# Patient Record
Sex: Male | Born: 1990
Health system: Southern US, Community
[De-identification: ages and names within clinical notes are randomized; demographics above are authoritative.]

## PROBLEM LIST (undated history)

## (undated) DIAGNOSIS — F909 Attention-deficit hyperactivity disorder, unspecified type: Secondary | ICD-10-CM

## (undated) HISTORY — PX: APPENDECTOMY: SHX54

---
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
## 2010-01-10 ENCOUNTER — Ambulatory Visit: Payer: Self-pay | Admitting: Pediatrics

## 2010-04-19 ENCOUNTER — Ambulatory Visit: Payer: Self-pay | Admitting: Pediatrics

## 2010-07-24 LAB — DIFFERENTIAL
Basophils Absolute: 0 10*3/uL (ref 0.0–0.1)
Basophils Absolute: 0 10*3/uL (ref 0.0–0.1)
Basophils Relative: 0 % (ref 0–1)
Eosinophils Absolute: 0.2 10*3/uL (ref 0.0–0.7)
Eosinophils Relative: 3 % (ref 0–5)
Lymphocytes Relative: 26 % (ref 12–46)
Monocytes Relative: 6 % (ref 3–12)

## 2010-07-24 LAB — CBC
HCT: 48.7 % (ref 39.0–52.0)
Hemoglobin: 16.9 g/dL (ref 13.0–17.0)
MCHC: 35.2 g/dL (ref 30.0–36.0)
MCHC: 35.8 g/dL (ref 30.0–36.0)
MCV: 89.4 fL (ref 78.0–100.0)
MCV: 89.8 fL (ref 78.0–100.0)
Platelets: 161 10*3/uL (ref 150–400)
Platelets: 163 10*3/uL (ref 150–400)
RBC: 5.36 MIL/uL (ref 4.22–5.81)
RDW: 12.4 % (ref 11.5–15.5)
RDW: 12.5 % (ref 11.5–15.5)

## 2010-07-24 LAB — COMPREHENSIVE METABOLIC PANEL
ALT: 41 U/L (ref 0–53)
AST: 35 U/L (ref 0–37)
Albumin: 4.5 g/dL (ref 3.5–5.2)
Alkaline Phosphatase: 108 U/L (ref 39–117)
BUN: 10 mg/dL (ref 6–23)
Calcium: 9.4 mg/dL (ref 8.4–10.5)
GFR calc Af Amer: >60 mL/min (ref >60)
Total Bilirubin: 0.7 mg/dL (ref 0.3–1.2)
Total Protein: 7.8 g/dL (ref 6.0–8.3)

## 2010-07-24 LAB — LIPASE, BLOOD
Lipase: 21 U/L (ref 11–59)
Lipase: 22 U/L (ref 11–59)

## 2010-07-24 LAB — POCT I-STAT, CHEM 8
Chloride: 102 mEq/L (ref 96–112)
HCT: 53 % — ABNORMAL HIGH (ref 39.0–52.0)
Hemoglobin: 18 g/dL — ABNORMAL HIGH (ref 13.0–17.0)
TCO2: 28 mmol/L (ref 0–100)

## 2010-07-24 LAB — AMYLASE: Amylase: 73 U/L (ref 0–105)

## 2010-08-22 ENCOUNTER — Inpatient Hospital Stay (INDEPENDENT_AMBULATORY_CARE_PROVIDER_SITE_OTHER)
Admission: RE | Admit: 2010-08-22 | Discharge: 2010-08-22 | Disposition: A | Discharge status: Home / Self Care | Payer: 59 | Source: Ambulatory Visit | Attending: Family Medicine | Admitting: Family Medicine

## 2010-08-22 DIAGNOSIS — S20229A Contusion of unspecified back wall of thorax, initial encounter: Secondary | ICD-10-CM

## 2010-08-22 DIAGNOSIS — M799 Soft tissue disorder, unspecified: Secondary | ICD-10-CM

## 2010-08-27 ENCOUNTER — Institutional Professional Consult (permissible substitution) (INDEPENDENT_AMBULATORY_CARE_PROVIDER_SITE_OTHER): Payer: 59 | Admitting: Family

## 2010-08-27 DIAGNOSIS — F909 Attention-deficit hyperactivity disorder, unspecified type: Secondary | ICD-10-CM

## 2010-12-30 ENCOUNTER — Institutional Professional Consult (permissible substitution) (INDEPENDENT_AMBULATORY_CARE_PROVIDER_SITE_OTHER): Payer: 59 | Admitting: Family

## 2010-12-30 DIAGNOSIS — F909 Attention-deficit hyperactivity disorder, unspecified type: Secondary | ICD-10-CM

## 2011-02-26 ENCOUNTER — Emergency Department (HOSPITAL_COMMUNITY)
Admission: EM | Admit: 2011-02-26 | Discharge: 2011-02-26 | Disposition: A | Discharge status: Home / Self Care | Payer: 59 | Attending: Emergency Medicine | Admitting: Emergency Medicine

## 2011-02-26 ENCOUNTER — Emergency Department (HOSPITAL_COMMUNITY): Payer: 59

## 2011-02-26 DIAGNOSIS — S61409A Unspecified open wound of unspecified hand, initial encounter: Secondary | ICD-10-CM | POA: Insufficient documentation

## 2011-02-26 DIAGNOSIS — M79609 Pain in unspecified limb: Secondary | ICD-10-CM | POA: Insufficient documentation

## 2011-02-26 DIAGNOSIS — W278XXA Contact with other nonpowered hand tool, initial encounter: Secondary | ICD-10-CM | POA: Insufficient documentation

## 2011-03-04 NOTE — Consult Note (Signed)
NAMETREYVION, DURKEE NO.:  [id number]  MEDICAL RECORD NO.:  [id number]  LOCATION:  [facility]                         FACILITY:  [facility]  PHYSICIAN:  Dionne Ano. Rosezetta Balderston, M.D.DATE OF BIRTH:  1991/02/09  DATE OF CONSULTATION: DATE OF DISCHARGE:  02/26/2011                                CONSULTATION   I had the pleasure of seeing Donold Marotto in the emergency room.  He is a 20 year old right-hand-dominant male who sustained a laceration to his left hand today.  He cannot extend his index finger.  I was asked to see him by the emergency room staff and take over his care.  He notes pain. He has been given anesthetic in the finger.  I have reviewed this at length and his findings.  His mother is here with him.  PAST MEDICAL HISTORY:  ADD.  CURRENT MEDICINE:  Vyvanse.  ALLERGIES:  None.  PAST SURGICAL HISTORY:  Appendectomy.  SOCIAL HISTORY:  He does not smoke or drink.  He works at [employer] in [employer] arena.  PHYSICAL EXAMINATION:  GENERAL:  Pleasant male, alert and oriented, in no acute distress. VITAL SIGNS:  Stable. HEENT:  Within normal limits. CHEST:  Clear. ABDOMEN:  Nontender. EXTREMITIES:  Lower extremity examination is benign.  Right upper extremity is neurovascular intact.  His tetanus is up-to-date and I checked this.  Left hand shows a laceration, oblique in nature over the hand, dorsal in location.  This begins radially to the MCP joint and courses proximal and oblique ulnarly to the middle finger region.  The patient complains of inability to extend the finger and has an obvious tendon injury.  I have reviewed this at length and his findings.  I have gone ahead and performed careful evaluation.  Following this, I have consented the patient for I and D and repair reconstruction as necessary.  The patient was taken to the procedure suite and underwent an additional intermetacarpal block.  He tolerated this well.  Following this,  the patient then underwent a very careful and cautious I and D.  He underwent I and D of skin, subcutaneous tissue, tendon, muscle and joint capsule.  He tolerated this well.  Greater than 3 L of saline were placed through the wound and following this, I then made sure that the excisional debridement was in excellent repair and we performed repair of the EDC to the index finger and EIP to the index finger.  Two separate tendon repairs were accomplished without difficulty.  The patient tolerated this well.  There were no complicating features. Following repairing the tendons, I then very carefully made sure good tension and quality of my repair.  This looked excellent.  3-0 FiberWire about the EIP and EDC were used to repair the tendon and the patient tolerated this well.  There were no complicating features.  Following this, the patient underwent suturing of the wound, complex in nature. The patient tolerated this well.  There were no complicating features.  Thus, the patient underwent I and D of skin, subcutaneous tissue, and tendon as well as bony tissue.  This was performed with combination of curette, scissor tip knife,  blade and orthopedic instrument.  The patient also underwent repair of the EDC and EIP, 2 separate extensor tendon repairs about the hand.  The patient did tolerate this quite well, and there were no complicating features.  Following this, we of course performed a very careful and calculated cast placement.  X-ray have performed after cast application did not show any signs of obvious bony abnormality.  The patient was informed of this.  He was discharged home on Keflex as well as Vicodin.  He is going to return to see [place] in 12 days.  Sutures to be removed.  Steri-Strips p.r.n. and at that time, we will place him in an additional cast to regime for 2 weeks and at 4 weeks with immobilization but at 2 weeks, we will begin PIP range of motion, very gently allowing  50% within the confines of the cast and full DIP motion.  We will make sure that he has some PIP range of motion to keep the MCP in extension.  At 4 weeks, full range of motion with therapeutic endeavors.  He will work on getting his motion back from weeks 4-8.  I have written him a note to be out of work until Monday and then light duty only.  These notes have been discussed. All questions encouraged and answered.     Dionne Ano. Amanda Pea, M.D.     [facility]  D:  02/26/2011  T:  02/26/2011  Job:  [id number]  Electronically Signed by Dominica Severin M.D. on 03/04/2011 05:41:47 AM

## 2011-04-03 ENCOUNTER — Institutional Professional Consult (permissible substitution): Payer: 59 | Admitting: Family

## 2011-04-03 DIAGNOSIS — F909 Attention-deficit hyperactivity disorder, unspecified type: Secondary | ICD-10-CM

## 2011-04-03 DIAGNOSIS — R279 Unspecified lack of coordination: Secondary | ICD-10-CM

## 2011-04-07 ENCOUNTER — Institutional Professional Consult (permissible substitution) (INDEPENDENT_AMBULATORY_CARE_PROVIDER_SITE_OTHER): Payer: 59 | Admitting: Family

## 2011-04-07 DIAGNOSIS — F909 Attention-deficit hyperactivity disorder, unspecified type: Secondary | ICD-10-CM

## 2011-07-28 ENCOUNTER — Institutional Professional Consult (permissible substitution): Payer: 59 | Admitting: Family

## 2011-08-18 ENCOUNTER — Institutional Professional Consult (permissible substitution) (INDEPENDENT_AMBULATORY_CARE_PROVIDER_SITE_OTHER): Payer: 59 | Admitting: Family

## 2011-08-18 DIAGNOSIS — F909 Attention-deficit hyperactivity disorder, unspecified type: Secondary | ICD-10-CM

## 2011-10-22 ENCOUNTER — Institutional Professional Consult (permissible substitution) (INDEPENDENT_AMBULATORY_CARE_PROVIDER_SITE_OTHER): Payer: 59 | Admitting: Family

## 2011-10-22 DIAGNOSIS — F909 Attention-deficit hyperactivity disorder, unspecified type: Secondary | ICD-10-CM

## 2012-01-14 ENCOUNTER — Institutional Professional Consult (permissible substitution) (INDEPENDENT_AMBULATORY_CARE_PROVIDER_SITE_OTHER): Payer: 59 | Admitting: Family

## 2012-01-14 DIAGNOSIS — F909 Attention-deficit hyperactivity disorder, unspecified type: Secondary | ICD-10-CM

## 2012-01-14 DIAGNOSIS — R279 Unspecified lack of coordination: Secondary | ICD-10-CM

## 2012-04-07 ENCOUNTER — Institutional Professional Consult (permissible substitution) (INDEPENDENT_AMBULATORY_CARE_PROVIDER_SITE_OTHER): Payer: 59 | Admitting: Family

## 2012-04-07 DIAGNOSIS — F909 Attention-deficit hyperactivity disorder, unspecified type: Secondary | ICD-10-CM

## 2012-06-24 ENCOUNTER — Institutional Professional Consult (permissible substitution) (INDEPENDENT_AMBULATORY_CARE_PROVIDER_SITE_OTHER): Payer: 59 | Admitting: Family

## 2012-06-24 DIAGNOSIS — F909 Attention-deficit hyperactivity disorder, unspecified type: Secondary | ICD-10-CM

## 2012-10-05 ENCOUNTER — Institutional Professional Consult (permissible substitution) (INDEPENDENT_AMBULATORY_CARE_PROVIDER_SITE_OTHER): Payer: 59 | Admitting: Family

## 2012-10-05 DIAGNOSIS — F909 Attention-deficit hyperactivity disorder, unspecified type: Secondary | ICD-10-CM

## 2012-12-27 ENCOUNTER — Institutional Professional Consult (permissible substitution) (INDEPENDENT_AMBULATORY_CARE_PROVIDER_SITE_OTHER): Payer: 59 | Admitting: Family

## 2012-12-27 DIAGNOSIS — F909 Attention-deficit hyperactivity disorder, unspecified type: Secondary | ICD-10-CM

## 2013-03-25 ENCOUNTER — Institutional Professional Consult (permissible substitution) (INDEPENDENT_AMBULATORY_CARE_PROVIDER_SITE_OTHER): Payer: 59 | Admitting: Family

## 2013-03-25 DIAGNOSIS — F909 Attention-deficit hyperactivity disorder, unspecified type: Secondary | ICD-10-CM

## 2013-07-07 ENCOUNTER — Institutional Professional Consult (permissible substitution) (INDEPENDENT_AMBULATORY_CARE_PROVIDER_SITE_OTHER): Payer: 59 | Admitting: Family

## 2013-07-07 DIAGNOSIS — F909 Attention-deficit hyperactivity disorder, unspecified type: Secondary | ICD-10-CM

## 2013-10-14 ENCOUNTER — Institutional Professional Consult (permissible substitution) (INDEPENDENT_AMBULATORY_CARE_PROVIDER_SITE_OTHER): Payer: 59 | Admitting: Family

## 2013-10-14 DIAGNOSIS — F909 Attention-deficit hyperactivity disorder, unspecified type: Secondary | ICD-10-CM

## 2014-01-15 ENCOUNTER — Emergency Department: Payer: Self-pay | Admitting: Emergency Medicine

## 2014-01-16 ENCOUNTER — Institutional Professional Consult (permissible substitution) (INDEPENDENT_AMBULATORY_CARE_PROVIDER_SITE_OTHER): Payer: 59 | Admitting: Family

## 2014-01-16 DIAGNOSIS — F909 Attention-deficit hyperactivity disorder, unspecified type: Secondary | ICD-10-CM

## 2014-01-16 DIAGNOSIS — R279 Unspecified lack of coordination: Secondary | ICD-10-CM

## 2014-04-07 ENCOUNTER — Institutional Professional Consult (permissible substitution) (INDEPENDENT_AMBULATORY_CARE_PROVIDER_SITE_OTHER): Payer: 59 | Admitting: Family

## 2014-04-07 DIAGNOSIS — F900 Attention-deficit hyperactivity disorder, predominantly inattentive type: Secondary | ICD-10-CM

## 2014-07-20 ENCOUNTER — Institutional Professional Consult (permissible substitution) (INDEPENDENT_AMBULATORY_CARE_PROVIDER_SITE_OTHER): Payer: 59 | Admitting: Family

## 2014-07-20 DIAGNOSIS — F902 Attention-deficit hyperactivity disorder, combined type: Secondary | ICD-10-CM | POA: Diagnosis not present

## 2014-10-26 ENCOUNTER — Institutional Professional Consult (permissible substitution): Payer: 59 | Admitting: Family

## 2014-10-26 DIAGNOSIS — F900 Attention-deficit hyperactivity disorder, predominantly inattentive type: Secondary | ICD-10-CM | POA: Diagnosis not present

## 2014-11-28 ENCOUNTER — Emergency Department (HOSPITAL_COMMUNITY)
Admission: EM | Admit: 2014-11-28 | Discharge: 2014-11-28 | Disposition: A | Discharge status: Home / Self Care | Payer: 59 | Attending: Emergency Medicine | Admitting: Emergency Medicine

## 2014-11-28 ENCOUNTER — Encounter (HOSPITAL_COMMUNITY): Payer: Self-pay

## 2014-11-28 ENCOUNTER — Emergency Department (HOSPITAL_COMMUNITY): Payer: 59

## 2014-11-28 DIAGNOSIS — W2209XA Striking against other stationary object, initial encounter: Secondary | ICD-10-CM | POA: Diagnosis not present

## 2014-11-28 DIAGNOSIS — Y9289 Other specified places as the place of occurrence of the external cause: Secondary | ICD-10-CM | POA: Diagnosis not present

## 2014-11-28 DIAGNOSIS — M79641 Pain in right hand: Secondary | ICD-10-CM

## 2014-11-28 DIAGNOSIS — Y999 Unspecified external cause status: Secondary | ICD-10-CM | POA: Insufficient documentation

## 2014-11-28 DIAGNOSIS — Y9389 Activity, other specified: Secondary | ICD-10-CM | POA: Diagnosis not present

## 2014-11-28 DIAGNOSIS — S6991XA Unspecified injury of right wrist, hand and finger(s), initial encounter: Secondary | ICD-10-CM | POA: Insufficient documentation

## 2014-11-28 HISTORY — DX: Attention-deficit hyperactivity disorder, unspecified type: F90.9

## 2014-11-28 MED ORDER — IBUPROFEN 800 MG PO TABS
800.0000 mg | ORAL_TABLET | Freq: Once | ORAL | Status: AC
  Administered 2014-11-28: 800 mg via ORAL
  Filled 2014-11-28: qty 1

## 2014-11-28 MED ORDER — NAPROXEN 500 MG PO TABS: 500.0000 mg | ORAL_TABLET | Freq: Two times a day (BID) | ORAL | Status: DC
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
## 2014-11-28 NOTE — Discharge Instructions (Signed)
Please follow up with your primary care physician in 1-2 days. If you do not have one please call the [facility] and [facility] number listed above. Please read all discharge instructions and return precautions.   Hand Contusion A hand contusion is a deep bruise on your hand area. Contusions are the result of an injury that caused bleeding under the skin. The contusion may turn blue, purple, or yellow. Minor injuries will give you a painless contusion, but more severe contusions may stay painful and swollen for a few weeks. CAUSES  A contusion is usually caused by a blow, trauma, or direct force to an area of the body. SYMPTOMS   Swelling and redness of the injured area.  Discoloration of the injured area.  Tenderness and soreness of the injured area.  Pain. DIAGNOSIS  The diagnosis can be made by taking a history and performing a physical exam. An X-ray, CT scan, or MRI may be needed to determine if there were any associated injuries, such as broken bones (fractures). TREATMENT  Often, the best treatment for a hand contusion is resting, elevating, icing, and applying cold compresses to the injured area. Over-the-counter medicines may also be recommended for pain control. HOME CARE INSTRUCTIONS   Put ice on the injured area.  Put ice in a plastic bag.  Place a towel between your skin and the bag.  Leave the ice on for 15-20 minutes, 03-04 times a day.  Only take over-the-counter or prescription medicines as directed by your caregiver. Your caregiver may recommend avoiding anti-inflammatory medicines (aspirin, ibuprofen, and naproxen) for 48 hours because these medicines may increase bruising.  If told, use an elastic wrap as directed. This can help reduce swelling. You may remove the wrap for sleeping, showering, and bathing. If your fingers become numb, cold, or blue, take the wrap off and reapply it more loosely.  Elevate your hand with pillows to reduce swelling.  Avoid  overusing your hand if it is painful. SEEK IMMEDIATE MEDICAL CARE IF:   You have increased redness, swelling, or pain in your hand.  Your swelling or pain is not relieved with medicines.  You have loss of feeling in your hand or are unable to move your fingers.  Your hand turns cold or blue.  You have pain when you move your fingers.  Your hand becomes warm to the touch.  Your contusion does not improve in 2 days. MAKE SURE YOU:   Understand these instructions.  Will watch your condition.  Will get help right away if you are not doing well or get worse. Document Released: 10/11/2001 Document Revised: 01/14/2012 Document Reviewed: 10/13/2011 [facility] Patient Information 2015 [place], [place]. This information is not intended to replace advice given to you by your health care provider. Make sure you discuss any questions you have with your health care provider. RICE: Routine Care for Injuries The routine care of many injuries includes Rest, Ice, Compression, and Elevation (RICE). HOME CARE INSTRUCTIONS  Rest is needed to allow your body to heal. Routine activities can usually be resumed when comfortable. Injured tendons and bones can take up to 6 weeks to heal. Tendons are the cord-like structures that attach muscle to bone.  Ice following an injury helps keep the swelling down and reduces pain.  Put ice in a plastic bag.  Place a towel between your skin and the bag.  Leave the ice on for 15-20 minutes, 3-4 times a day, or as directed by your health care provider. Do  this while awake, for the first 24 to 48 hours. After that, continue as directed by your caregiver.  Compression helps keep swelling down. It also gives support and helps with discomfort. If an elastic bandage has been applied, it should be removed and reapplied every 3 to 4 hours. It should not be applied tightly, but firmly enough to keep swelling down. Watch fingers or toes for swelling, bluish discoloration,  coldness, numbness, or excessive pain. If any of these problems occur, remove the bandage and reapply loosely. Contact your caregiver if these problems continue.  Elevation helps reduce swelling and decreases pain. With extremities, such as the arms, hands, legs, and feet, the injured area should be placed near or above the level of the heart, if possible. SEEK IMMEDIATE MEDICAL CARE IF:  You have persistent pain and swelling.  You develop redness, numbness, or unexpected weakness.  Your symptoms are getting worse rather than improving after several days. These symptoms may indicate that further evaluation or further X-rays are needed. Sometimes, X-rays may not show a small broken bone (fracture) until 1 week or 10 days later. Make a follow-up appointment with your caregiver. Ask when your X-ray results will be ready. Make sure you get your X-ray results. Document Released: 08/03/2000 Document Revised: 04/26/2013 Document Reviewed: 09/20/2010 [facility] Patient Information 2015 [place], [place]. This information is not intended to replace advice given to you by your health care provider. Make sure you discuss any questions you have with your health care provider.
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
## 2014-12-01 ENCOUNTER — Ambulatory Visit (HOSPITAL_COMMUNITY)
Admission: RE | Admit: 2014-12-01 | Discharge: 2014-12-01 | Disposition: A | Discharge status: Home / Self Care | Payer: 59 | Attending: Psychiatry | Admitting: Psychiatry

## 2014-12-01 DIAGNOSIS — F909 Attention-deficit hyperactivity disorder, unspecified type: Secondary | ICD-10-CM | POA: Diagnosis present

## 2014-12-01 DIAGNOSIS — F329 Major depressive disorder, single episode, unspecified: Secondary | ICD-10-CM | POA: Insufficient documentation
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
## 2014-12-05 ENCOUNTER — Ambulatory Visit (INDEPENDENT_AMBULATORY_CARE_PROVIDER_SITE_OTHER): Payer: BLUE CROSS/BLUE SHIELD | Admitting: Psychology

## 2014-12-05 DIAGNOSIS — F4321 Adjustment disorder with depressed mood: Secondary | ICD-10-CM | POA: Diagnosis not present

## 2014-12-15 ENCOUNTER — Ambulatory Visit: Payer: BLUE CROSS/BLUE SHIELD | Admitting: Psychology

## 2015-01-05 ENCOUNTER — Ambulatory Visit (INDEPENDENT_AMBULATORY_CARE_PROVIDER_SITE_OTHER): Payer: BLUE CROSS/BLUE SHIELD | Admitting: Psychology

## 2015-01-05 ENCOUNTER — Institutional Professional Consult (permissible substitution): Payer: BLUE CROSS/BLUE SHIELD | Admitting: Family

## 2015-01-05 DIAGNOSIS — F4321 Adjustment disorder with depressed mood: Secondary | ICD-10-CM

## 2015-01-05 DIAGNOSIS — F320 Major depressive disorder, single episode, mild: Secondary | ICD-10-CM | POA: Diagnosis not present

## 2015-01-05 DIAGNOSIS — F902 Attention-deficit hyperactivity disorder, combined type: Secondary | ICD-10-CM | POA: Diagnosis not present

## 2015-05-23 MED FILL — ALINIA 500 MG TABLET: 500 | 3 days supply | Qty: 6 | Fill #0

## 2015-05-23 MED FILL — predniSONE 10 MG TABS: 10 | 9 days supply | Qty: 21 | Fill #0

## 2015-11-13 MED FILL — HYDROCODON-APAP 5-325: 5-325 | 2 days supply | Qty: 25 | Fill #0

## 2015-11-13 MED FILL — CLINDAMYCIN HCL 150 MG CAP: 150 | 7 days supply | Qty: 28 | Fill #0

## 2016-03-19 MED FILL — DOXYCYCLINE HYCLATE 100 MG: 100 | 10 days supply | Qty: 19 | Fill #0

## 2016-05-24 ENCOUNTER — Encounter (HOSPITAL_COMMUNITY): Payer: Self-pay | Admitting: *Deleted

## 2016-05-24 ENCOUNTER — Ambulatory Visit (HOSPITAL_COMMUNITY)
Admission: EM | Admit: 2016-05-24 | Discharge: 2016-05-24 | Disposition: A | Discharge status: Home / Self Care | Payer: PRIVATE HEALTH INSURANCE | Attending: Family Medicine | Admitting: Family Medicine

## 2016-05-24 DIAGNOSIS — J9801 Acute bronchospasm: Secondary | ICD-10-CM | POA: Diagnosis not present

## 2016-05-24 DIAGNOSIS — J069 Acute upper respiratory infection, unspecified: Secondary | ICD-10-CM

## 2016-05-24 MED ORDER — ALBUTEROL SULFATE HFA 108 (90 BASE) MCG/ACT IN AERS: 2.0000 | INHALATION_SPRAY | RESPIRATORY_TRACT | 0 refills | Status: AC | PRN

## 2016-05-24 MED ORDER — PREDNISONE 50 MG PO TABS: ORAL_TABLET | ORAL | 0 refills | Status: DC

## 2016-05-24 NOTE — ED Provider Notes (Signed)
CSN: [id number]     Arrival date & time 05/24/16  1206 History   First MD Initiated Contact with Patient 05/24/16 1322     Chief Complaint  Patient presents with  . Cough   (Consider location/radiation/quality/duration/timing/severity/associated sxs/prior Treatment) 26 year old male states he is having some shortness of breath, wheezing, DOE and upper respiratory congestion for about 2 weeks. He states that 2 nights ago he believes he had a subjective fever. At that time he took one ibuprofen otherwise he states he medications. His vital signs are currently within normal limits he is afebrile. He has no history of smoking or asthma.      Past Medical History:  Diagnosis Date  . ADHD (attention deficit hyperactivity disorder)    Past Surgical History:  Procedure Laterality Date  . APPENDECTOMY     History reviewed. No pertinent family history. Social History  Substance Use Topics  . Smoking status: Never Smoker  . Smokeless tobacco: Not on file  . Alcohol use Yes     Comment: occasionally     Review of Systems  Constitutional: Positive for activity change and fever. Negative for diaphoresis and fatigue.  HENT: Positive for congestion and postnasal drip. Negative for ear pain, facial swelling, rhinorrhea, sore throat and trouble swallowing.   Eyes: Negative for pain, discharge and redness.  Respiratory: Positive for cough, shortness of breath and wheezing. Negative for chest tightness.   Cardiovascular: Negative.   Gastrointestinal: Negative.   Musculoskeletal: Negative.  Negative for neck pain and neck stiffness.  Neurological: Negative.   All other systems reviewed and are negative.   Allergies  Patient has no known allergies.  Home Medications   Prior to Admission medications   Medication Sig Start Date End Date Taking? Authorizing Provider  albuterol (PROVENTIL HFA;VENTOLIN HFA) 108 (90 Base) MCG/ACT inhaler Inhale 2 puffs into the lungs every 4 (four) hours as  needed for wheezing or shortness of breath. 05/24/16   Hayden Rasmussen, NP  naproxen (NAPROSYN) 500 MG tablet Take 1 tablet (500 mg total) by mouth 2 (two) times daily with a meal. 11/28/14   Francee Piccolo, PA-C  predniSONE (DELTASONE) 50 MG tablet 1 tab po daily for 6 days. Take with food. 05/24/16   Hayden Rasmussen, NP   Meds Ordered and Administered this Visit  Medications - No data to display  BP 132/78 (BP Location: Right Arm)   Pulse 78   Temp 98.6 F (37 C) (Oral)   Resp 18   SpO2 98%  No data found.   Physical Exam  Constitutional: He is oriented to person, place, and time. He appears well-developed and well-nourished. No distress.  HENT:  Right Ear: External ear normal.  Left Ear: External ear normal.  Mouth/Throat: No oropharyngeal exudate.  Oropharynx with minor erythema and light cobblestoning otherwise clear.  Eyes: EOM are normal. Pupils are equal, round, and reactive to light.  Neck: Normal range of motion. Neck supple.  Cardiovascular: Normal rate, regular rhythm and normal heart sounds.   Pulmonary/Chest: Effort normal. No respiratory distress. He has no rales.  Forced expiration reveals distant and expiratory wheeze, cough produces bilateral coarseness. Mildly prolonged expiratory phase.  Musculoskeletal: Normal range of motion. He exhibits no edema.  Neurological: He is alert and oriented to person, place, and time.  Skin: Skin is warm and dry.  Psychiatric: He has a normal mood and affect.  Nursing note and vitals reviewed.   Urgent Care Course     Procedures (including critical care time)  Labs Review Labs Reviewed - No data to display  Imaging Review No results found.   Visual Acuity Review  Right Eye Distance:   Left Eye Distance:   Bilateral Distance:    Right Eye Near:   Left Eye Near:    Bilateral Near:         MDM   1. Cough due to bronchospasm   2. Acute upper respiratory infection    Your cough is coming primarily from  bronchospasm. Use the albuterol inhaler 2 puffs every 4 hours as needed for cough and wheeze. Take the prednisone daily as directed. Take with food. Also recommend taking Allegra or Zyrtec daily to help prevent drainage. Drink plenty fluids stay well-hydrated, use lots of saline nasal spray. Meds ordered this encounter  Medications  . albuterol (PROVENTIL HFA;VENTOLIN HFA) 108 (90 Base) MCG/ACT inhaler    Sig: Inhale 2 puffs into the lungs every 4 (four) hours as needed for wheezing or shortness of breath.    Dispense:  1 Inhaler    Refill:  0    Order Specific Question:   Supervising Provider    Answer:   Elvina SidleLAUENSTEIN, KURT [[id number]]  . predniSONE (DELTASONE) 50 MG tablet    Sig: 1 tab po daily for 6 days. Take with food.    Dispense:  6 tablet    Refill:  0    Order Specific Question:   Supervising Provider    Answer:   Lonia BloodLAUENSTEIN, KURT [[id number]]       Hayden Rasmussenavid Graison Leinberger, NP 05/24/16 1339

## 2016-05-24 NOTE — Discharge Instructions (Signed)
Your cough is coming primarily from bronchospasm. Use the albuterol inhaler 2 puffs every 4 hours as needed for cough and wheeze. Take the prednisone daily as directed. Take with food. Also recommend taking Allegra or Zyrtec daily to help prevent drainage. Drink plenty fluids stay well-hydrated, use lots of saline nasal spray.

## 2016-05-24 NOTE — ED Triage Notes (Signed)
Pt  Reports   Symptoms  Of   Cough  /  Congestion  For  Several   Weeks  Pt      Reports  He  Did  Not  recive   A  Flu  Shot         He  Ambulated  To  Room  With a   Steady  Fluid  Gait   He   Is  Awake  And  Alert  He  Reports  Some  Shortness  of  Breath

## 2016-05-24 NOTE — ED Notes (Signed)
[facility] [address].  [facility]

## 2016-06-09 ENCOUNTER — Encounter (HOSPITAL_COMMUNITY): Payer: Self-pay | Admitting: Emergency Medicine

## 2016-06-09 ENCOUNTER — Ambulatory Visit (HOSPITAL_COMMUNITY)
Admission: EM | Admit: 2016-06-09 | Discharge: 2016-06-09 | Disposition: A | Discharge status: Home / Self Care | Payer: PRIVATE HEALTH INSURANCE | Attending: Internal Medicine | Admitting: Internal Medicine

## 2016-06-09 DIAGNOSIS — K219 Gastro-esophageal reflux disease without esophagitis: Secondary | ICD-10-CM

## 2016-06-09 DIAGNOSIS — J9801 Acute bronchospasm: Secondary | ICD-10-CM | POA: Diagnosis not present

## 2016-06-09 DIAGNOSIS — R0982 Postnasal drip: Secondary | ICD-10-CM

## 2016-06-09 MED ORDER — PREDNISONE 10 MG PO TABS: 20.0000 mg | ORAL_TABLET | Freq: Every day | ORAL | 0 refills | Status: DC

## 2016-06-09 MED ORDER — ALBUTEROL SULFATE (2.5 MG/3ML) 0.083% IN NEBU
INHALATION_SOLUTION | RESPIRATORY_TRACT | Status: AC
  Filled 2016-06-09: qty 3

## 2016-06-09 MED ORDER — SODIUM CHLORIDE 0.9 % IN NEBU
INHALATION_SOLUTION | RESPIRATORY_TRACT | Status: AC
  Filled 2016-06-09: qty 3

## 2016-06-09 MED ORDER — BECLOMETHASONE DIPROPIONATE 80 MCG/ACT IN AERS: 1.0000 | INHALATION_SPRAY | Freq: Two times a day (BID) | RESPIRATORY_TRACT | 12 refills | Status: DC

## 2016-06-09 MED ORDER — ALBUTEROL SULFATE (2.5 MG/3ML) 0.083% IN NEBU
2.5000 mg | INHALATION_SOLUTION | Freq: Once | RESPIRATORY_TRACT | Status: AC
  Administered 2016-06-09: 2.5 mg via RESPIRATORY_TRACT

## 2016-06-09 NOTE — ED Provider Notes (Signed)
CSN: [id number]     Arrival date & time 06/09/16  1857 History   First MD Initiated Contact with Patient 06/09/16 2107     Chief Complaint  Patient presents with  . Cough   (Consider location/radiation/quality/duration/timing/severity/associated sxs/prior Treatment) 26 year old male who states he was seen in this urgent care a couple weeks ago for PND, wheezing and cough presents today with similar symptoms. He was treated with albuterol HFA and prednisone. He states he was better for a week or so but symptoms have recently returned. His also complaining of acid reflux. He continues to use his albuterol inhaler but is taking no other medications. He states in the morning he has a lot of drainage and does followed by vomiting. He is also taking Allegra for PND. 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      Past Medical History:  Diagnosis Date  . ADHD (attention deficit hyperactivity disorder)    Past Surgical History:  Procedure Laterality Date  . APPENDECTOMY     History reviewed. No pertinent family history. Social History  Substance Use Topics  . Smoking status: Never Smoker  . Smokeless tobacco: Not on file  . Alcohol use Yes     Comment: occasionally     Review of Systems  Constitutional: Negative.   HENT: Positive for congestion and postnasal drip.   Eyes: Negative.   Respiratory: Positive for cough and wheezing.   Gastrointestinal:       Reflux  Genitourinary: Negative.   Musculoskeletal: Negative.   Neurological: Negative.   All other systems reviewed and are negative.   Allergies  Patient has no known allergies.  Home Medications   Prior to Admission medications   Medication Sig Start Date End Date Taking? Authorizing Provider  albuterol (PROVENTIL HFA;VENTOLIN HFA) 108 (90 Base) MCG/ACT inhaler Inhale 2 puffs into the lungs every 4 (four) hours as needed for wheezing or shortness of breath. 05/24/16  Yes Hayden Rasmussen, NP  naproxen (NAPROSYN) 500 MG tablet Take 1  tablet (500 mg total) by mouth 2 (two) times daily with a meal. 11/28/14  Yes Jennifer Piepenbrink, PA-C  beclomethasone (QVAR) 80 MCG/ACT inhaler Inhale 1 puff into the lungs 2 (two) times daily. 06/09/16   Hayden Rasmussen, NP  predniSONE (DELTASONE) 10 MG tablet Take 2 tablets (20 mg total) by mouth daily. Take with food 06/09/16   Hayden Rasmussen, NP   Meds Ordered and Administered this Visit   Medications  albuterol (PROVENTIL) (2.5 MG/3ML) 0.083% nebulizer solution 2.5 mg (2.5 mg Nebulization Given 06/09/16 2122)    BP 126/69 (BP Location: Right Arm)   Pulse 82   Temp 99.2 F (37.3 C) (Oral)   Resp 20   SpO2 97%  No data found.   Physical Exam  Constitutional: He is oriented to person, place, and time. He appears well-developed and well-nourished. No distress.  HENT:  Head: Normocephalic and atraumatic.  Right Ear: External ear normal.  Left Ear: External ear normal.  Mouth/Throat: No oropharyngeal exudate.  The lateral TMs are normal. Oropharynx with minor erythema and clear PND.  Eyes: EOM are normal. Pupils are equal, round, and reactive to light.  Neck: Normal range of motion. Neck supple.  Cardiovascular: Normal rate, regular rhythm and normal heart sounds.   Pulmonary/Chest: Effort normal. No respiratory distress.  With tidal volume lungs are clear. With forced expiration and cough there is an expiratory wheeze.  Musculoskeletal: Normal range of motion. He exhibits no edema.  Neurological: He is alert and oriented to  person, place, and time.  Skin: Skin is warm and dry.  Nursing note and vitals reviewed.   Urgent Care Course     Procedures (including critical care time)  Labs Review Labs Reviewed - No data to display  Imaging Review No results found.   Visual Acuity Review  Right Eye Distance:   Left Eye Distance:   Bilateral Distance:    Right Eye Near:   Left Eye Near:    Bilateral Near:         MDM   1. Cough due to bronchospasm   2. PND (post-nasal  drip)   3. Gastroesophageal reflux disease, esophagitis presence not specified   After the albuterol nebulizer the patient states he is breathing better and easier. Auscultation reveals clear lungs and good chest expansion. Continue taking the Allegra as directed daily. You may also add Chlor-Trimeton 4 mg tablets at nighttime. This may cause some drowsiness but will increase drying of the drainage in the back of your throat. Drink plenty of fluids and drink a glass of water before bedtime and when you get up. Take Zantac 150 mg twice a day as his over-the-counter. Use the inhalers as directed. Follow-up with your primary care doctor call for an appointment. Meds ordered this encounter  Medications  . albuterol (PROVENTIL) (2.5 MG/3ML) 0.083% nebulizer solution 2.5 mg  . beclomethasone (QVAR) 80 MCG/ACT inhaler    Sig: Inhale 1 puff into the lungs 2 (two) times daily.    Dispense:  1 Inhaler    Refill:  12    Order Specific Question:   Supervising Provider    Answer:   Eustace MooreMURRAY, LAURA W [[id number]]  . predniSONE (DELTASONE) 10 MG tablet    Sig: Take 2 tablets (20 mg total) by mouth daily. Take with food    Dispense:  15 tablet    Refill:  0    Order Specific Question:   Supervising Provider    Answer:   Eustace MooreMURRAY, LAURA W [[id number]]       Hayden Rasmussenavid Tyquasia Pant, NP 06/09/16 2146    Hayden Rasmussenavid Bayyinah Dukeman, NP 06/09/16 2147

## 2016-06-09 NOTE — Discharge Instructions (Addendum)
Continue taking the Allegra as directed daily. You may also add Chlor-Trimeton 4 mg tablets at nighttime. This may cause some drowsiness but will increase drying of the drainage in the back of your throat. Drink plenty of fluids and drink a glass of water before bedtime and when you get up. Take Zantac 150 mg twice a day as his over-the-counter. Use the inhalers as directed. Follow-up with your primary care doctor call for an appointment.

## 2016-06-09 NOTE — ED Triage Notes (Signed)
[facility] a [facility]. The patient stated that he was evaluated on 05/24/2016 for the same complaint and has not gotten any better.

## 2016-06-10 MED FILL — QVAR 80 MCG ORAL INHALER: 80 | 60 days supply | Qty: 9 | Fill #0

## 2016-06-10 MED FILL — predniSONE 10 MG TABS: 10 | 8 days supply | Qty: 15 | Fill #0

## 2017-03-24 ENCOUNTER — Emergency Department (HOSPITAL_COMMUNITY)
Admission: EM | Admit: 2017-03-24 | Discharge: 2017-03-24 | Disposition: A | Discharge status: Home / Self Care | Payer: PRIVATE HEALTH INSURANCE | Attending: Emergency Medicine | Admitting: Emergency Medicine

## 2017-03-24 ENCOUNTER — Encounter (HOSPITAL_COMMUNITY): Payer: Self-pay | Admitting: Emergency Medicine

## 2017-03-24 ENCOUNTER — Emergency Department (HOSPITAL_COMMUNITY): Payer: PRIVATE HEALTH INSURANCE

## 2017-03-24 DIAGNOSIS — J189 Pneumonia, unspecified organism: Secondary | ICD-10-CM | POA: Insufficient documentation

## 2017-03-24 DIAGNOSIS — Z79899 Other long term (current) drug therapy: Secondary | ICD-10-CM | POA: Insufficient documentation

## 2017-03-24 DIAGNOSIS — R0789 Other chest pain: Secondary | ICD-10-CM | POA: Diagnosis not present

## 2017-03-24 MED ORDER — DOXYCYCLINE HYCLATE 100 MG PO CAPS: 100.0000 mg | ORAL_CAPSULE | Freq: Two times a day (BID) | ORAL | 0 refills | Status: AC

## 2017-03-24 MED FILL — DOXYCYCLINE HYCLATE 100 MG: 100 | 7 days supply | Qty: 14 | Fill #0

## 2017-03-24 NOTE — ED Provider Notes (Signed)
[facility]   CSN: [id number] Arrival date & time: 03/24/17  1059     History   Chief Complaint Chief Complaint  Patient presents with  . Cough    HPI Neil Becker is a 26 y.o. male.  HPI  26 y.o. male, presents to the Emergency Department today due to cough and chest congestion with onset Saturday. Pt notes productive cough with yellow sputum. No hemoptysis. No fevers. Notes post tussive emesis. Able to tolerate PO. Denies CP/SOB/ABD pain. No diarrhea. OTC medications with moderate relief. No sick contacts. Denies pain currently. No other symptoms noted    Past Medical History:  Diagnosis Date  . ADHD (attention deficit hyperactivity disorder)     There are no active problems to display for this patient.   Past Surgical History:  Procedure Laterality Date  . APPENDECTOMY         Home Medications    Prior to Admission medications   Medication Sig Start Date End Date Taking? Authorizing Provider  albuterol (PROVENTIL HFA;VENTOLIN HFA) 108 (90 Base) MCG/ACT inhaler Inhale 2 puffs into the lungs every 4 (four) hours as needed for wheezing or shortness of breath. 05/24/16   Hayden RasmussenMabe, David, NP  beclomethasone (QVAR) 80 MCG/ACT inhaler Inhale 1 puff into the lungs 2 (two) times daily. 06/09/16   Hayden RasmussenMabe, David, NP  naproxen (NAPROSYN) 500 MG tablet Take 1 tablet (500 mg total) by mouth 2 (two) times daily with a meal. 11/28/14   Piepenbrink, Victorino DikeJennifer, PA-C  predniSONE (DELTASONE) 10 MG tablet Take 2 tablets (20 mg total) by mouth daily. Take with food 06/09/16   Hayden RasmussenMabe, David, NP    Family History History reviewed. No pertinent family history.  Social History Social History   Tobacco Use  . Smoking status: Never Smoker  . Smokeless tobacco: Never Used  Substance Use Topics  . Alcohol use: Yes    Comment: occasionally   . Drug use: No     Allergies   Patient has no known allergies.   Review of Systems Review of  Systems ROS reviewed and all are negative for acute change except as noted in the HPI.  Physical Exam Updated Vital Signs BP (!) 155/96 (BP Location: Right Arm)   Pulse 86   Temp 98.3 F (36.8 C) (Oral)   Resp 18   SpO2 98%   Physical Exam  Constitutional: He is oriented to person, place, and time. He appears well-developed and well-nourished. No distress.  HENT:  Head: Normocephalic and atraumatic.  Right Ear: Tympanic membrane, external ear and ear canal normal.  Left Ear: Tympanic membrane, external ear and ear canal normal.  Nose: Nose normal.  Mouth/Throat: Uvula is midline, oropharynx is clear and moist and mucous membranes are normal. No trismus in the jaw. No oropharyngeal exudate, posterior oropharyngeal erythema or tonsillar abscesses.  Eyes: EOM are normal. Pupils are equal, round, and reactive to light.  Neck: Normal range of motion. Neck supple. No tracheal deviation present.  Cardiovascular: Normal rate, regular rhythm, S1 normal, S2 normal, normal heart sounds, intact distal pulses and normal pulses.  Pulmonary/Chest: Effort normal and breath sounds normal. No respiratory distress. He has no decreased breath sounds. He has no wheezes. He has no rhonchi. He has no rales.  Abdominal: Normal appearance and bowel sounds are normal. There is no tenderness.  Musculoskeletal: Normal range of motion.  Neurological: He is alert and oriented to person, place, and time.  Skin: Skin is warm and  dry.  Psychiatric: He has a normal mood and affect. His speech is normal and behavior is normal. Thought content normal.  Nursing note and vitals reviewed.    ED Treatments / Results  Labs (all labs ordered are listed, but only abnormal results are displayed) Labs Reviewed - No data to display  EKG  EKG Interpretation None       Radiology Dg Chest 2 View  Result Date: 03/24/2017 CLINICAL DATA:  Off and chest congestion and chest tightness for the past 3-4 days. Nonsmoker.  EXAM: CHEST  2 VIEW COMPARISON:  Chest x-ray of July 18, 2007 FINDINGS: The lungs are adequately inflated. The interstitial markings are coarse especially in the lingula. There is no alveolar infiltrate. There is no pleural effusion or pneumothorax. The heart and pulmonary vascularity are normal. The trachea is midline. The bony thorax exhibits no acute abnormality. IMPRESSION: Subsegmental atelectasis or early pneumonia in the lingula. Electronically Signed   By: David  SwazilandJordan M.D.   On: 03/24/2017 11:52    Procedures Procedures (including critical care time)  Medications Ordered in ED Medications - No data to display   Initial Impression / Assessment and Plan / ED Course  I have reviewed the triage vital signs and the nursing notes.  Pertinent labs & imaging results that were available during my care of the patient were reviewed by me and considered in my medical decision making (see chart for details).  Final Clinical Impressions(s) / ED Diagnoses   {I have reviewed and evaluated the relevant imaging studies.  {I have reviewed the relevant previous healthcare records.  {I obtained HPI from historian.   ED Course:  Assessment: Pt is a 26 y.o. male presents to the Emergency Department today due to cough and chest congestion with onset Saturday. Pt notes productive cough with yellow sputum. No hemoptysis. No fevers. Notes post tussive emesis. Able to tolerate PO. Denies CP/SOB/ABD pain. No diarrhea. OTC medications with moderate relief. No sick contacts. Denies pain currently. On exam, pt in NAD. Nontoxic/nonseptic appearing. VSS. Afebrile. Lungs CTA. Heart RRR. Abdomen nontender soft. CXR with possible early penumonia. Given Doxy. Plan is to DC home with follow up to PCP. At time of discharge, Patient is in no acute distress. Vital Signs are stable. Patient is able to ambulate. Patient able to tolerate PO.   Disposition/Plan:  DC Home Additional Verbal discharge instructions given and  discussed with patient.  Pt Instructed to f/u with PCP in the next week for evaluation and treatment of symptoms. Return precautions given Pt acknowledges and agrees with plan  Supervising Physician Mabe, Latanya MaudlinMartha L, MD  Final diagnoses:  Community acquired pneumonia, unspecified laterality    ED Discharge Orders    None       Audry PiliMohr, Delano Scardino, PA-C 03/24/17 1211    Mabe, Latanya MaudlinMartha L, MD 03/24/17 1214

## 2017-03-24 NOTE — Discharge Instructions (Signed)
Please read and follow all provided instructions.  Your diagnoses today include:  1. Community acquired pneumonia, unspecified laterality     Tests performed today include: Chest x-ray -- shows pneumonia Vital signs. See below for your results today.   Medications prescribed:   Take any prescribed medications only as directed.  Home care instructions:  Follow any educational materials contained in this packet.  Take the complete course of antibiotics that you were prescribed.   BE VERY CAREFUL not to take multiple medicines containing Tylenol (also called acetaminophen). Doing so can lead to an overdose which can damage your liver and cause liver failure and possibly death.   Follow-up instructions: Please follow-up with your primary care provider in the next 3 days for further evaluation of your symptoms and to ensure resolution of your infection.   Return instructions:  Please return to the Emergency Department if you experience worsening symptoms.  Return immediately with worsening breathing, worsening shortness of breath, or if you feel it is taking you more effort to breathe.  Please return if you have any other emergent concerns.  Additional Information:  Your vital signs today were: BP (!) 155/96 (BP Location: Right Arm)    Pulse 86    Temp 98.3 F (36.8 C) (Oral)    Resp 18    SpO2 98%  If your blood pressure (BP) was elevated above 135/85 this visit, please have this repeated by your doctor within one month. --------------

## 2017-03-24 NOTE — ED Triage Notes (Signed)
Pt to ER for evaluation of cough and chest congestion with moderate yellow/green phlegm production. Pt a/o x4. Lung sounds clear to auscultation. NAD. VSS

## 2018-10-26 ENCOUNTER — Ambulatory Visit (INDEPENDENT_AMBULATORY_CARE_PROVIDER_SITE_OTHER): Payer: Medicaid Other

## 2018-10-26 ENCOUNTER — Encounter: Payer: Self-pay | Admitting: Podiatry

## 2018-10-26 ENCOUNTER — Ambulatory Visit (INDEPENDENT_AMBULATORY_CARE_PROVIDER_SITE_OTHER): Payer: Medicaid Other | Admitting: Podiatry

## 2018-10-26 ENCOUNTER — Other Ambulatory Visit: Payer: Self-pay | Admitting: Podiatry

## 2018-10-26 ENCOUNTER — Other Ambulatory Visit: Payer: Self-pay

## 2018-10-26 VITALS — Temp 97.4°F

## 2018-10-26 DIAGNOSIS — M722 Plantar fascial fibromatosis: Secondary | ICD-10-CM | POA: Diagnosis not present

## 2018-10-26 MED ORDER — METHYLPREDNISOLONE 4 MG PO TBPK: ORAL_TABLET | ORAL | 0 refills | Status: DC

## 2018-10-26 MED ORDER — MELOXICAM 15 MG PO TABS: 15.0000 mg | ORAL_TABLET | Freq: Every day | ORAL | 1 refills | Status: AC

## 2018-10-28 NOTE — Progress Notes (Signed)
   Subjective: 28 y.o. male presenting today as a new patient with a chief complaint of bilateral heel pain, left greater than right, that has been getting progressively worse over the past few years. He states the pain is worse in the morning when he first gets out of bed and when he stands after sitting for a long period of time. He has been taking Ibuprofen, stretching and icing the feet. Patient is here for further evaluation and treatment.   Past Medical History:  Diagnosis Date  . ADHD (attention deficit hyperactivity disorder)      Objective: Physical Exam General: The patient is alert and oriented x3 in no acute distress.  Dermatology: Skin is warm, dry and supple bilateral lower extremities. Negative for open lesions or macerations bilateral.   Vascular: Dorsalis Pedis and Posterior Tibial pulses palpable bilateral.  Capillary fill time is immediate to all digits.  Neurological: Epicritic and protective threshold intact bilateral.   Musculoskeletal: Tenderness to palpation to the plantar aspect of the bilateral heels along the plantar fascia. All other joints range of motion within normal limits bilateral. Strength 5/5 in all groups bilateral.   Radiographic exam: Normal osseous mineralization. Joint spaces preserved. No fracture/dislocation/boney destruction. No other soft tissue abnormalities or radiopaque foreign bodies.   Assessment: 1. plantar fasciitis bilateral feet  Plan of Care:  1. Patient evaluated. Xrays reviewed.   2. Injection of 0.5cc Celestone soluspan injected into the bilateral heels.  3. Rx for Medrol Dose Pak placed 4. Rx for Meloxicam ordered for patient. 5. Plantar fascial band(s) dispensed for bilateral plantar fasciitis. 6. Instructed patient regarding therapies and modalities at home to alleviate symptoms.  7. Return to clinic in 4 weeks.    Edrick Kins, DPM [facility]  Dr. Edrick Kins, DPM    [address], [place] [address]                Office [phone]  Fax [phone]

## 2018-11-23 ENCOUNTER — Other Ambulatory Visit: Payer: Self-pay

## 2018-11-23 ENCOUNTER — Ambulatory Visit (INDEPENDENT_AMBULATORY_CARE_PROVIDER_SITE_OTHER): Payer: Medicaid Other | Admitting: Podiatry

## 2018-11-23 ENCOUNTER — Encounter: Payer: Self-pay | Admitting: Podiatry

## 2018-11-23 VITALS — Temp 98.7°F

## 2018-11-23 DIAGNOSIS — M722 Plantar fascial fibromatosis: Secondary | ICD-10-CM | POA: Diagnosis not present

## 2018-11-23 MED ORDER — DICLOFENAC SODIUM 75 MG PO TBEC: 75.0000 mg | DELAYED_RELEASE_TABLET | Freq: Two times a day (BID) | ORAL | 1 refills | Status: DC

## 2018-11-25 NOTE — Progress Notes (Signed)
   Subjective: 28 y.o. male presenting today for follow up evaluation of plantar fasciitis of bilateral feet. He states the pain has improved some but has not resolved. He reports temporary relief after receiving the injections. He has completed the Medrol Dose Pak and states it gave him diarrhea. He has been taking Meloxicam as directed. Being on his feet for long periods of time increases his pain. Patient is here for further evaluation and treatment.   Past Medical History:  Diagnosis Date  . ADHD (attention deficit hyperactivity disorder)      Objective: Physical Exam General: The patient is alert and oriented x3 in no acute distress.  Dermatology: Skin is warm, dry and supple bilateral lower extremities. Negative for open lesions or macerations bilateral.   Vascular: Dorsalis Pedis and Posterior Tibial pulses palpable bilateral.  Capillary fill time is immediate to all digits.  Neurological: Epicritic and protective threshold intact bilateral.   Musculoskeletal: Tenderness to palpation to the plantar aspect of the bilateral heels along the plantar fascia. All other joints range of motion within normal limits bilateral. Strength 5/5 in all groups bilateral.   Assessment: 1. plantar fasciitis bilateral feet  Plan of Care:  1. Patient evaluated.    2. Injection of 0.5cc Celestone soluspan injected into the bilateral heels.  3. Prescription for Diclofenac provided to patient. Discontinue taking Meloxicam.  4. Continue using plantar fascial braces.  5. Return to clinic in 4 weeks.    Edrick Kins, DPM [facility]  Dr. Edrick Kins, DPM    [address], [place] [address]                Office [phone]  Fax [phone]

## 2018-12-21 ENCOUNTER — Other Ambulatory Visit: Payer: Self-pay

## 2018-12-21 ENCOUNTER — Ambulatory Visit (INDEPENDENT_AMBULATORY_CARE_PROVIDER_SITE_OTHER): Payer: Medicaid Other | Admitting: Podiatry

## 2018-12-21 ENCOUNTER — Encounter: Payer: Self-pay | Admitting: Podiatry

## 2018-12-21 VITALS — Temp 97.7°F

## 2018-12-21 DIAGNOSIS — M722 Plantar fascial fibromatosis: Secondary | ICD-10-CM | POA: Diagnosis not present

## 2018-12-23 NOTE — Progress Notes (Signed)
   Subjective: 28 y.o. male presenting today for follow up evaluation of plantar fasciitis of bilateral feet. He states he has been doing well and his pain was improving until earlier this week. He reports some mild pain this week. He states the injections provided significant relief. He has also been taking Diclofenac which has helped alleviate the pain. There are no worsening factors noted. Patient is here for further evaluation and treatment.   Past Medical History:  Diagnosis Date  . ADHD (attention deficit hyperactivity disorder)      Objective: Physical Exam General: The patient is alert and oriented x3 in no acute distress.  Dermatology: Skin is warm, dry and supple bilateral lower extremities. Negative for open lesions or macerations bilateral.   Vascular: Dorsalis Pedis and Posterior Tibial pulses palpable bilateral.  Capillary fill time is immediate to all digits.  Neurological: Epicritic and protective threshold intact bilateral.   Musculoskeletal: Tenderness to palpation to the plantar aspect of the bilateral heels along the plantar fascia. All other joints range of motion within normal limits bilateral. Strength 5/5 in all groups bilateral.   Assessment: 1. plantar fasciitis bilateral feet  Plan of Care:  1. Patient evaluated.    2. Injection of 0.5cc Celestone soluspan injected into the bilateral heels.  3. Continue taking Diclofenac.  4. OTC insoles provided today.  5. Return to clinic in 6 weeks.     Edrick Kins, DPM [facility]  Dr. Edrick Kins, DPM    [address], [place] [address]                Office [phone]  Fax [phone]

## 2019-02-01 ENCOUNTER — Ambulatory Visit: Payer: Medicaid Other | Admitting: Podiatry

## 2019-02-11 ENCOUNTER — Encounter: Payer: Self-pay | Admitting: Podiatry

## 2019-02-11 ENCOUNTER — Ambulatory Visit (INDEPENDENT_AMBULATORY_CARE_PROVIDER_SITE_OTHER): Payer: Medicaid Other | Admitting: Podiatry

## 2019-02-11 ENCOUNTER — Other Ambulatory Visit: Payer: Self-pay

## 2019-02-11 DIAGNOSIS — M722 Plantar fascial fibromatosis: Secondary | ICD-10-CM

## 2019-02-11 DIAGNOSIS — M25372 Other instability, left ankle: Secondary | ICD-10-CM

## 2019-02-14 NOTE — Progress Notes (Signed)
   Subjective: 28 y.o. male presenting today for follow up evaluation of plantar fasciitis of the bilateral feet. He states the right foot is doing much better. He states the pain in the left foot has not changed at all. He reports significant continued pain. He states the injections helped relieve the pain temporarily but then it returned. He has been taking Diclofenac for treatment. Walking and being on the foot increases the pain. Patient is here for further evaluation and treatment.    Past Medical History:  Diagnosis Date  . ADHD (attention deficit hyperactivity disorder)      Objective: Physical Exam General: The patient is alert and oriented x3 in no acute distress.  Dermatology: Skin is warm, dry and supple bilateral lower extremities. Negative for open lesions or macerations bilateral.   Vascular: Dorsalis Pedis and Posterior Tibial pulses palpable bilateral.  Capillary fill time is immediate to all digits.  Neurological: Epicritic and protective threshold intact bilateral.   Musculoskeletal: Tenderness to palpation to the plantar aspect of the left heel along the plantar fascia. All other joints range of motion within normal limits bilateral. Strength 5/5 in all groups bilateral.    Assessment: 1. Plantar fasciitis left foot 2. Ankle instability left   Plan of Care:  1. Patient evaluated. 2. Injection of 0.5cc Celestone soluspan injected into the left plantar fascia.  3. Rx for Diclofenac ordered for patient. 4. Ankle brace dispensed.  5. Return to clinic in 4 weeks.   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    Edrick Kins, DPM [facility]  Dr. Edrick Kins, DPM    [address], [place] [address]                Office [phone]  Fax [phone]

## 2019-03-18 ENCOUNTER — Ambulatory Visit (INDEPENDENT_AMBULATORY_CARE_PROVIDER_SITE_OTHER): Payer: Medicaid Other | Admitting: Podiatry

## 2019-03-18 ENCOUNTER — Other Ambulatory Visit: Payer: Self-pay

## 2019-03-18 DIAGNOSIS — M25372 Other instability, left ankle: Secondary | ICD-10-CM

## 2019-03-18 DIAGNOSIS — M722 Plantar fascial fibromatosis: Secondary | ICD-10-CM

## 2019-03-22 NOTE — Progress Notes (Signed)
   Subjective: 28 y.o. male presenting today for follow up evaluation of plantar fasciitis of the left foot as well as left ankle instability. He states he is doing well. He has been taking the Diclofenac for treatment. Walking and being on the foot increases the pain. Patient is here for further evaluation and treatment.    Past Medical History:  Diagnosis Date  . ADHD (attention deficit hyperactivity disorder)      Objective: Physical Exam General: The patient is alert and oriented x3 in no acute distress.  Dermatology: Skin is warm, dry and supple bilateral lower extremities. Negative for open lesions or macerations bilateral.   Vascular: Dorsalis Pedis and Posterior Tibial pulses palpable bilateral.  Capillary fill time is immediate to all digits.  Neurological: Epicritic and protective threshold intact bilateral.   Musculoskeletal: Tenderness to palpation to the plantar aspect of the left heel along the plantar fascia. All other joints range of motion within normal limits bilateral. Strength 5/5 in all groups bilateral.    Assessment: 1. Plantar fasciitis left foot 2. Ankle instability left   Plan of Care:  1. Patient evaluated. 2. Injection of 0.5cc Celestone soluspan injected into the left plantar fascia.  3. Continue taking Diclofenac.  4. Patient considering custom orthotics.  5. Continue using OTC insoles.  6. Return to clinic as needed for injection or surgical consultation.   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    Edrick Kins, DPM [facility]  Dr. Edrick Kins, DPM    [address], [place] [address]                Office [phone]  Fax [phone]

## 2019-04-28 IMAGING — DX DG CHEST 2V
2 series · 2 of 2 positions shown · non-contrast
Comparison: Chest x-ray of July 18, 2007

CLINICAL DATA: Off and chest congestion and chest tightness for the
past 3-4 days. Nonsmoker.

EXAM:
CHEST  2 VIEW

[chest pa]
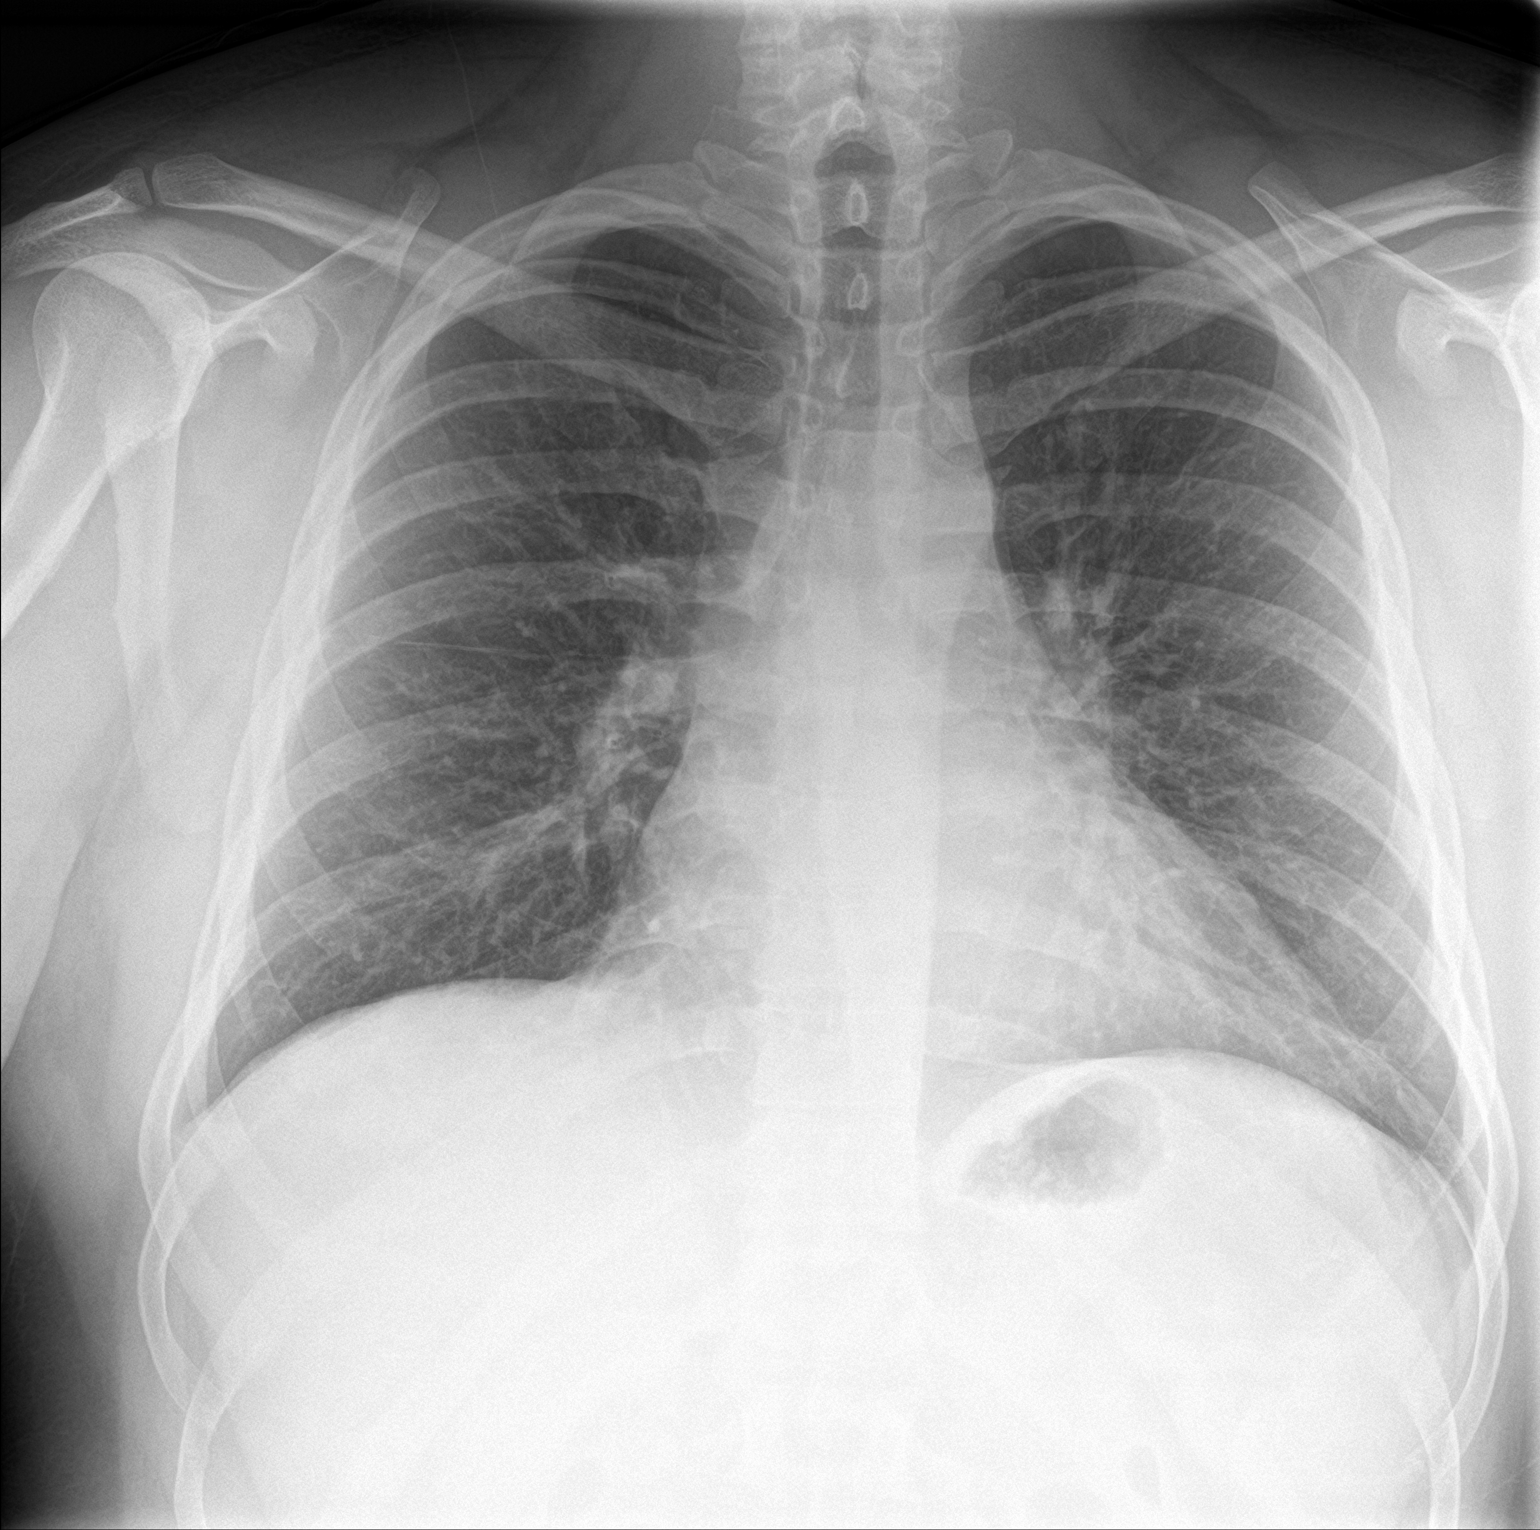

[chest lat]
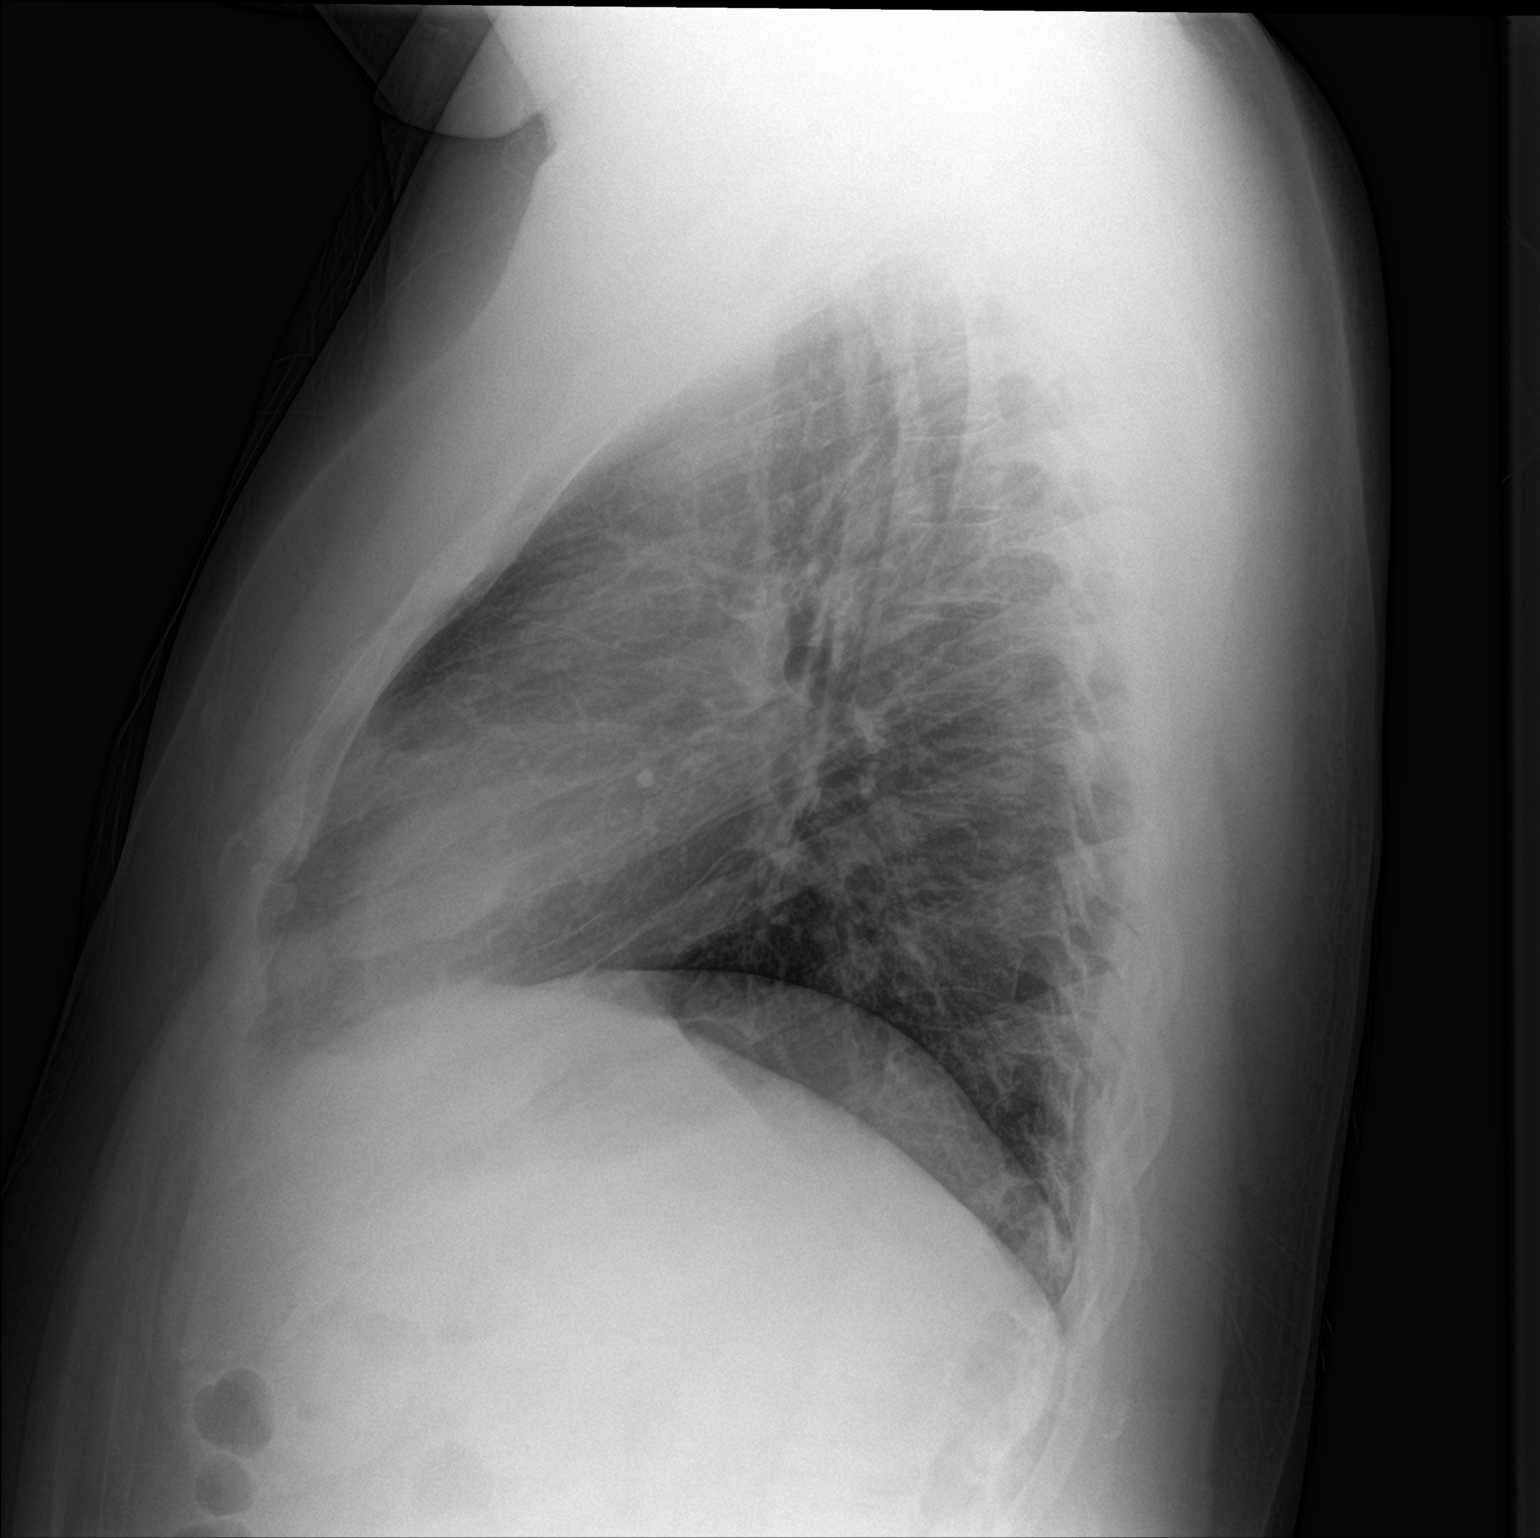

[2 of 2 positions shown; findings below may reference images not displayed]

FINDINGS: The lungs are adequately inflated. The interstitial markings are
coarse especially in the lingula. There is no alveolar infiltrate.
There is no pleural effusion or pneumothorax. The heart and
pulmonary vascularity are normal. The trachea is midline. The bony
thorax exhibits no acute abnormality.
IMPRESSION: Subsegmental atelectasis or early pneumonia in the lingula.

## 2019-07-22 ENCOUNTER — Other Ambulatory Visit: Payer: Self-pay

## 2019-07-22 ENCOUNTER — Encounter: Payer: Self-pay | Admitting: Podiatry

## 2019-07-22 ENCOUNTER — Ambulatory Visit: Payer: Medicaid Other | Admitting: Podiatry

## 2019-07-22 DIAGNOSIS — M25372 Other instability, left ankle: Secondary | ICD-10-CM

## 2019-07-22 DIAGNOSIS — M722 Plantar fascial fibromatosis: Secondary | ICD-10-CM | POA: Diagnosis not present

## 2019-07-22 MED ORDER — DICLOFENAC SODIUM 75 MG PO TBEC: 75.0000 mg | DELAYED_RELEASE_TABLET | Freq: Two times a day (BID) | ORAL | 2 refills | Status: AC

## 2019-07-26 NOTE — Progress Notes (Signed)
   Subjective: 29 y.o. male presenting today for follow up evaluation of plantar fasciitis of the left foot as well as left ankle instability. He reports some mild pain in the left foot. He also reports pain in the right heel that began last week. He has had injections in the past that has helped alleviate the symptoms. He has been taking Diclofenac which also helps. Being on the feet increases the pain. Patient is here for further evaluation and treatment.    Past Medical History:  Diagnosis Date  . ADHD (attention deficit hyperactivity disorder)      Objective: Physical Exam General: The patient is alert and oriented x3 in no acute distress.  Dermatology: Skin is warm, dry and supple bilateral lower extremities. Negative for open lesions or macerations bilateral.   Vascular: Dorsalis Pedis and Posterior Tibial pulses palpable bilateral.  Capillary fill time is immediate to all digits.  Neurological: Epicritic and protective threshold intact bilateral.   Musculoskeletal: Tenderness to palpation to the plantar aspect of the right heel along the plantar fascia. All other joints range of motion within normal limits bilateral. Strength 5/5 in all groups bilateral.    Assessment: 1. Plantar fasciitis left foot - resolved  2. Ankle instability left - stable  3. Plantar fasciitis right   Plan of Care:  1. Patient evaluated. 2. Injection of 0.5cc Celestone soluspan injected into the right plantar fascia.  3. Continue using OTC Powersteps.  4. Refill prescription for Diclofenac provided to patient.  5. Return to clinic as needed.    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    Felecia Shelling, DPM [facility]  Dr. Felecia Shelling, DPM    [address] [place], [place] [address]                Office [phone]  Fax [phone]

## 2020-05-15 ENCOUNTER — Other Ambulatory Visit: Payer: Self-pay

## 2020-05-15 ENCOUNTER — Other Ambulatory Visit: Payer: 59

## 2020-05-15 DIAGNOSIS — Z20822 Contact with and (suspected) exposure to covid-19: Secondary | ICD-10-CM

## 2020-05-17 LAB — SARS-COV-2, NAA 2 DAY TAT

## 2020-05-17 LAB — NOVEL CORONAVIRUS, NAA: SARS-CoV-2, NAA: NOT DETECTED
